# Patient Record
Sex: Male | Born: 1995 | Race: White | Hispanic: No | Marital: Single | Smoking: Current every day smoker
Health system: Southern US, Community
[De-identification: ages and names within clinical notes are randomized; demographics above are authoritative.]

## PROBLEM LIST (undated history)

## (undated) DIAGNOSIS — K219 Gastro-esophageal reflux disease without esophagitis: Secondary | ICD-10-CM

## (undated) DIAGNOSIS — J45909 Unspecified asthma, uncomplicated: Secondary | ICD-10-CM

## (undated) HISTORY — PX: NO PAST SURGERIES: SHX2092

---

## 2016-12-04 ENCOUNTER — Ambulatory Visit (HOSPITAL_COMMUNITY)
Admission: EM | Admit: 2016-12-04 | Discharge: 2016-12-04 | Disposition: A | Discharge status: Home / Self Care | Payer: Self-pay | Attending: Internal Medicine | Admitting: Internal Medicine

## 2016-12-04 ENCOUNTER — Encounter (HOSPITAL_COMMUNITY): Payer: Self-pay | Admitting: Family Medicine

## 2016-12-04 DIAGNOSIS — R300 Dysuria: Secondary | ICD-10-CM | POA: Insufficient documentation

## 2016-12-04 DIAGNOSIS — R369 Urethral discharge, unspecified: Secondary | ICD-10-CM | POA: Insufficient documentation

## 2016-12-04 LAB — POCT URINALYSIS DIP (DEVICE)
Glucose, UA: NEGATIVE mg/dL
Ketones, ur: NEGATIVE mg/dL
Nitrite: NEGATIVE
PH: 6 (ref 5.0–8.0)
Protein, ur: NEGATIVE mg/dL
Specific Gravity, Urine: 1.025 (ref 1.005–1.030)
UROBILINOGEN UA: 0.2 mg/dL (ref 0.0–1.0)

## 2016-12-04 MED ORDER — AZITHROMYCIN 250 MG PO TABS
1000.0000 mg | ORAL_TABLET | Freq: Once | ORAL | Status: AC
  Administered 2016-12-04: 1000 mg via ORAL

## 2016-12-04 MED ORDER — CEFTRIAXONE SODIUM 250 MG IJ SOLR
INTRAMUSCULAR | Status: AC
  Filled 2016-12-04: qty 250

## 2016-12-04 MED ORDER — AZITHROMYCIN 250 MG PO TABS
ORAL_TABLET | ORAL | Status: AC
  Filled 2016-12-04: qty 4

## 2016-12-04 MED ORDER — CEFTRIAXONE SODIUM 250 MG IJ SOLR
250.0000 mg | Freq: Once | INTRAMUSCULAR | Status: AC
  Administered 2016-12-04: 250 mg via INTRAMUSCULAR

## 2016-12-04 MED ORDER — CEPHALEXIN 500 MG PO CAPS: 500.0000 mg | ORAL_CAPSULE | Freq: Four times a day (QID) | ORAL | 0 refills | Status: DC

## 2016-12-04 NOTE — ED Provider Notes (Signed)
CSN: [id number]     Arrival date & time 12/04/16  1212 History   First MD Initiated Contact with Patient 12/04/16 1331     Chief Complaint  Patient presents with  . Dysuria   (Consider location/radiation/quality/duration/timing/severity/associated sxs/prior Treatment) 21 year old male presents to the urgent care with burning dysuria, penile discharge and cloudy urine. No associated frequency. No fever. He states that he believes he has a UTI.      History reviewed. No pertinent past medical history. History reviewed. No pertinent surgical history. History reviewed. No pertinent family history. Social History  Substance Use Topics  . Smoking status: Never Smoker  . Smokeless tobacco: Never Used  . Alcohol use Not on file    Review of Systems  Constitutional: Negative.   HENT: Negative.   Respiratory: Negative.   Gastrointestinal: Negative.   Genitourinary: Positive for discharge and dysuria. Negative for frequency, hematuria, penile swelling, scrotal swelling, testicular pain and urgency.  All other systems reviewed and are negative.   Allergies  Patient has no known allergies.  Home Medications   Prior to Admission medications   Medication Sig Start Date End Date Taking? Authorizing Provider  cephALEXin (KEFLEX) 500 MG capsule Take 1 capsule (500 mg total) by mouth 4 (four) times daily. 12/04/16   Hayden Rasmussenavid Trexton Escamilla, NP   Meds Ordered and Administered this Visit   Medications  cefTRIAXone (ROCEPHIN) injection 250 mg (not administered)  azithromycin (ZITHROMAX) tablet 1,000 mg (not administered)    BP 138/93   Pulse 66   Temp 98.4 F (36.9 C)   Resp 18   SpO2 100%  No data found.   Physical Exam  Constitutional: He is oriented to person, place, and time. He appears well-developed and well-nourished. No distress.  Eyes: EOM are normal.  Neck: Normal range of motion. Neck supple.  Cardiovascular: Normal rate.   Pulmonary/Chest: Effort normal. No respiratory distress.   Musculoskeletal: He exhibits no edema.  Neurological: He is alert and oriented to person, place, and time. He exhibits normal muscle tone.  Skin: Skin is warm and dry.  Psychiatric: He has a normal mood and affect.  Nursing note and vitals reviewed.   Urgent Care Course     Procedures (including critical care time)  Labs Review Labs Reviewed  POCT URINALYSIS DIP (DEVICE) - Abnormal; Notable for the following:       Result Value   Bilirubin Urine SMALL (*)    Hgb urine dipstick TRACE (*)    Leukocytes, UA TRACE (*)    All other components within normal limits  URINE CULTURE  URINE CYTOLOGY ANCILLARY ONLY    Imaging Review No results found.   Visual Acuity Review  Right Eye Distance:   Left Eye Distance:   Bilateral Distance:    Right Eye Near:   Left Eye Near:    Bilateral Near:         MDM   1. Dysuria   2. Urethral discharge in male   You are being treated for possible STD although this has not been diagnosed at this time. Her symptoms suggest STD but your urinalysis and symptoms could also be due to a urinary tract infection. A culture. Urine is underway and the result should be ready in about 3 days. Testing for STD has also been sent to the lab and we should have those results and 1-2 days. Take the medication as directed, drink plenty of fluids and stay well-hydrated. Meds ordered this encounter  Medications  . cefTRIAXone (ROCEPHIN)  injection 250 mg  . azithromycin (ZITHROMAX) tablet 1,000 mg  . cephALEXin (KEFLEX) 500 MG capsule    Sig: Take 1 capsule (500 mg total) by mouth 4 (four) times daily.    Dispense:  28 capsule    Refill:  0    Order Specific Question:   Supervising Provider    Answer:   Eustace Moore [[id number]]        Hayden Rasmussen, NP 12/04/16 1343

## 2016-12-04 NOTE — Discharge Instructions (Signed)
You are being treated for possible STD although this has not been diagnosed at this time. Her symptoms suggest STD but your urinalysis and symptoms could also be due to a urinary tract infection. A culture. Urine is underway and the result should be ready in about 3 days. Testing for STD has also been sent to the lab and we should have those results and 1-2 days. Take the medication as directed, drink plenty of fluids and stay well-hydrated.

## 2016-12-04 NOTE — ED Triage Notes (Signed)
Pt here fir penile discharge, dysuria and cloudy urine x 1 week.

## 2016-12-05 LAB — URINE CULTURE
Culture: NO GROWTH
SPECIAL REQUESTS: NORMAL

## 2016-12-06 LAB — URINE CYTOLOGY ANCILLARY ONLY
CHLAMYDIA, DNA PROBE: NEGATIVE
Neisseria Gonorrhea: NEGATIVE
TRICH (WINDOWPATH): NEGATIVE

## 2018-01-05 ENCOUNTER — Emergency Department (HOSPITAL_COMMUNITY): Payer: PRIVATE HEALTH INSURANCE

## 2018-01-05 ENCOUNTER — Other Ambulatory Visit: Payer: Self-pay

## 2018-01-05 ENCOUNTER — Emergency Department (HOSPITAL_COMMUNITY)
Admission: EM | Admit: 2018-01-05 | Discharge: 2018-01-05 | Disposition: A | Discharge status: Home / Self Care | Payer: PRIVATE HEALTH INSURANCE | Attending: Emergency Medicine | Admitting: Emergency Medicine

## 2018-01-05 ENCOUNTER — Encounter (HOSPITAL_COMMUNITY): Payer: Self-pay

## 2018-01-05 DIAGNOSIS — F1210 Cannabis abuse, uncomplicated: Secondary | ICD-10-CM | POA: Insufficient documentation

## 2018-01-05 DIAGNOSIS — R112 Nausea with vomiting, unspecified: Secondary | ICD-10-CM

## 2018-01-05 LAB — CBC WITH DIFFERENTIAL/PLATELET
BASOS ABS: 0 10*3/uL (ref 0.0–0.1)
BASOS PCT: 0 %
Eosinophils Absolute: 0.1 10*3/uL (ref 0.0–0.7)
Eosinophils Relative: 1 %
HCT: 42.1 % (ref 39.0–52.0)
HEMOGLOBIN: 14.5 g/dL (ref 13.0–17.0)
LYMPHS PCT: 11 %
Lymphs Abs: 1.1 10*3/uL (ref 0.7–4.0)
MCH: 31 pg (ref 26.0–34.0)
MCHC: 34.4 g/dL (ref 30.0–36.0)
MCV: 90 fL (ref 78.0–100.0)
MONOS PCT: 4 %
Monocytes Absolute: 0.4 10*3/uL (ref 0.1–1.0)
NEUTROS ABS: 8.6 10*3/uL — AB (ref 1.7–7.7)
NEUTROS PCT: 84 %
Platelets: 157 10*3/uL (ref 150–400)
RBC: 4.68 MIL/uL (ref 4.22–5.81)
RDW: 13 % (ref 11.5–15.5)
WBC: 10.2 10*3/uL (ref 4.0–10.5)

## 2018-01-05 LAB — COMPREHENSIVE METABOLIC PANEL
ALBUMIN: 4.4 g/dL (ref 3.5–5.0)
ALK PHOS: 90 U/L (ref 38–126)
ALT: 33 U/L (ref 17–63)
AST: 28 U/L (ref 15–41)
Anion gap: 12 (ref 5–15)
BUN: 17 mg/dL (ref 6–20)
CALCIUM: 9.1 mg/dL (ref 8.9–10.3)
CO2: 24 mmol/L (ref 22–32)
CREATININE: 0.66 mg/dL (ref 0.61–1.24)
Chloride: 103 mmol/L (ref 101–111)
GFR calc Af Amer: >60 mL/min (ref >60)
GFR calc non Af Amer: >60 mL/min (ref >60)
GLUCOSE: 129 mg/dL — AB (ref 65–99)
Potassium: 4.1 mmol/L (ref 3.5–5.1)
Sodium: 139 mmol/L (ref 135–145)
TOTAL PROTEIN: 7.8 g/dL (ref 6.5–8.1)
Total Bilirubin: 1 mg/dL (ref 0.3–1.2)

## 2018-01-05 LAB — LIPASE, BLOOD: Lipase: 42 U/L (ref 11–51)

## 2018-01-05 LAB — URINALYSIS, ROUTINE W REFLEX MICROSCOPIC
BILIRUBIN URINE: NEGATIVE
GLUCOSE, UA: NEGATIVE mg/dL
Hgb urine dipstick: NEGATIVE
KETONES UR: 80 mg/dL — AB
Leukocytes, UA: NEGATIVE
Nitrite: NEGATIVE
Protein, ur: NEGATIVE mg/dL
Specific Gravity, Urine: 1.021 (ref 1.005–1.030)
pH: 7 (ref 5.0–8.0)

## 2018-01-05 LAB — RAPID URINE DRUG SCREEN, HOSP PERFORMED
Amphetamines: NOT DETECTED
BARBITURATES: NOT DETECTED
Benzodiazepines: NOT DETECTED
Cocaine: NOT DETECTED
Opiates: NOT DETECTED
Tetrahydrocannabinol: POSITIVE — AB

## 2018-01-05 MED ORDER — PANTOPRAZOLE SODIUM 40 MG IV SOLR
40.0000 mg | Freq: Once | INTRAVENOUS | Status: AC
  Administered 2018-01-05: 40 mg via INTRAVENOUS
  Filled 2018-01-05: qty 40

## 2018-01-05 MED ORDER — GI COCKTAIL ~~LOC~~
30.0000 mL | Freq: Once | ORAL | Status: AC
  Administered 2018-01-05: 30 mL via ORAL
  Filled 2018-01-05: qty 30

## 2018-01-05 MED ORDER — SODIUM CHLORIDE 0.9 % IV BOLUS
1000.0000 mL | Freq: Once | INTRAVENOUS | Status: AC
  Administered 2018-01-05: 1000 mL via INTRAVENOUS

## 2018-01-05 MED ORDER — PROCHLORPERAZINE EDISYLATE 5 MG/ML IJ SOLN
10.0000 mg | Freq: Once | INTRAMUSCULAR | Status: AC
  Administered 2018-01-05: 10 mg via INTRAVENOUS
  Filled 2018-01-05: qty 2

## 2018-01-05 MED ORDER — FAMOTIDINE 20 MG PO TABS: 20.0000 mg | ORAL_TABLET | Freq: Two times a day (BID) | ORAL | 0 refills | Status: DC

## 2018-01-05 MED ORDER — ONDANSETRON HCL 4 MG/2ML IJ SOLN
4.0000 mg | Freq: Once | INTRAMUSCULAR | Status: AC
  Administered 2018-01-05: 4 mg via INTRAVENOUS
  Filled 2018-01-05: qty 2

## 2018-01-05 MED ORDER — ONDANSETRON 4 MG PO TBDP: 4.0000 mg | ORAL_TABLET | Freq: Three times a day (TID) | ORAL | 0 refills | Status: DC | PRN

## 2018-01-05 NOTE — Discharge Instructions (Signed)
As we discussed, your nausea and vomiting could be due to bad food intake, viral infection, or marijuana use.  You should stop marijuana use as it can lead to nausea and vomiting.  Take the nausea medication as prescribed.  Keep yourself hydrated.  Return to the ED if you develop worsening symptoms including fever, persistent vomiting or right-sided lower abdominal pain or any other concerns.

## 2018-01-05 NOTE — ED Notes (Signed)
EDP at bedside updating patient. 

## 2018-01-05 NOTE — ED Notes (Signed)
Pt given gingerale. Instructed to take small, slow sips. Pt tolerating well at this time.

## 2018-01-05 NOTE — ED Notes (Signed)
Pt returned from xray

## 2018-01-05 NOTE — ED Provider Notes (Signed)
[facility] EMERGENCY DEPARTMENT Provider Note   CSN: [id number] Arrival date & time: 01/05/18  [id number]     History   Chief Complaint Chief Complaint  Patient presents with  . Emesis    HPI Francisco Schneider is a 22 y.o. male.  Patient's significant other reports patient awoke around midnight with nausea, vomiting, generalized body aches, chills.  Emesis consisted of food particles and now became clear with dry heaving.  Denies any blood in the emesis. no documented fever.  Felt well when he went to bed.  Ate hamburgers for dinner but believes they were cooked appropriately.  Denies any diarrhea.  Denies any recent antibiotic use or travel.  Denies any sick contacts.  Complains of abdominal "burning" with diffuse tenderness.  No previous abdominal surgeries.  No pain with urination or blood in the urine.  Patient did have "flulike symptoms" for the past 1 week with cough, sore throat, runny nose and sneezing but that improved 2 days ago.   The history is provided by the patient and the spouse.  Emesis   Associated symptoms include abdominal pain and chills. Pertinent negatives include no arthralgias, no cough, no diarrhea, no fever, no headaches and no myalgias.    History reviewed. No pertinent past medical history.  There are no active problems to display for this patient.   History reviewed. No pertinent surgical history.      Home Medications    Prior to Admission medications   Medication Sig Start Date End Date Taking? Authorizing Provider  cephALEXin (KEFLEX) 500 MG capsule Take 1 capsule (500 mg total) by mouth 4 (four) times daily. 12/04/16   Hayden RasmussenMabe, David, NP    Family History No family history on file.  Social History Social History   Tobacco Use  . Smoking status: Never Smoker  . Smokeless tobacco: Never Used  Substance Use Topics  . Alcohol use: Not on file  . Drug use: Not on file     Allergies   Patient has no known allergies.   Review of  Systems Review of Systems  Constitutional: Positive for activity change, appetite change and chills. Negative for fever.  HENT: Negative for congestion, rhinorrhea and sore throat.   Respiratory: Negative for cough, chest tightness and shortness of breath.   Cardiovascular: Negative for chest pain.  Gastrointestinal: Positive for abdominal pain, nausea and vomiting. Negative for diarrhea.  Genitourinary: Negative for dysuria, hematuria and testicular pain.  Musculoskeletal: Negative for arthralgias and myalgias.  Skin: Negative for rash.  Neurological: Positive for weakness. Negative for dizziness and headaches.   all other systems are negative except as noted in the HPI and PMH.     Physical Exam Updated Vital Signs BP 127/83   Pulse 73   Temp (!) 97.5 F (36.4 C)   Resp 18   Ht 5\' 11"  (1.803 m)   Wt 65.8 kg (145 lb)   SpO2 100%   BMI 20.22 kg/m   Physical Exam  Constitutional: He is oriented to person, place, and time. He appears well-developed and well-nourished. No distress.  Pale appearing  HENT:  Head: Normocephalic and atraumatic.  Mouth/Throat: Oropharynx is clear and moist. No oropharyngeal exudate.  Moist mucus membranes  Eyes: Pupils are equal, round, and reactive to light. Conjunctivae and EOM are normal.  Neck: Normal range of motion. Neck supple.  No meningismus.  Cardiovascular: Normal rate, regular rhythm, normal heart sounds and intact distal pulses.  No murmur heard. Pulmonary/Chest: Effort normal and breath sounds normal.  No respiratory distress.  Abdominal: Soft. There is tenderness. There is no rebound and no guarding.  Mild diffuse tenderness. No guarding or rebound  Musculoskeletal: Normal range of motion. He exhibits no edema or tenderness.  No CVAT  Neurological: He is alert and oriented to person, place, and time. No cranial nerve deficit. He exhibits normal muscle tone. Coordination normal.  No ataxia on finger to nose bilaterally. No pronator  drift. 5/5 strength throughout. CN 2-12 intact.Equal grip strength. Sensation intact.   Skin: Skin is warm.  Psychiatric: He has a normal mood and affect. His behavior is normal.  Nursing note and vitals reviewed.    ED Treatments / Results  Labs (all labs ordered are listed, but only abnormal results are displayed) Labs Reviewed  CBC WITH DIFFERENTIAL/PLATELET - Abnormal; Notable for the following components:      Result Value   Neutro Abs 8.6 (*)    All other components within normal limits  COMPREHENSIVE METABOLIC PANEL - Abnormal; Notable for the following components:   Glucose, Bld 129 (*)    All other components within normal limits  URINALYSIS, ROUTINE W REFLEX MICROSCOPIC - Abnormal; Notable for the following components:   APPearance CLOUDY (*)    Ketones, ur 80 (*)    All other components within normal limits  RAPID URINE DRUG SCREEN, HOSP PERFORMED - Abnormal; Notable for the following components:   Tetrahydrocannabinol POSITIVE (*)    All other components within normal limits  LIPASE, BLOOD    EKG None  Radiology Dg Abdomen Acute W/chest  Result Date: 01/05/2018 CLINICAL DATA:  Vomiting starting at midnight.  Prior cough. EXAM: DG ABDOMEN ACUTE W/ 1V CHEST COMPARISON:  None. FINDINGS: The lungs appear clear.  Cardiac and mediastinal contours normal. No pleural effusion identified. No free intraperitoneal gas. Formed stool in the colon. No significant amount of small bowel gas. No significant abnormal calcifications are identified. IMPRESSION: 1. No significant abnormality is identified. Reduced sensitivity in assessing the small bowel due to the relative lack of gas in the small bowel. Electronically Signed   By: Gaylyn Rong M.D.   On: 01/05/2018 08:03    Procedures Procedures (including critical care time)  Medications Ordered in ED Medications  sodium chloride 0.9 % bolus 1,000 mL (has no administration in time range)  ondansetron (ZOFRAN) injection 4 mg  (has no administration in time range)     Initial Impression / Assessment and Plan / ED Course  I have reviewed the triage vital signs and the nursing notes.  Pertinent labs & imaging results that were available during my care of the patient were reviewed by me and considered in my medical decision making (see chart for details).    Patient with nausea, vomiting, chills abdominal burning since midnight.  No diarrhea or fever Abdomen soft without peritoneal signs  Patient will be hydrated, given symptom control, will check labs and urine.  Labs reassuring.  Anion gap was normal.  Electrolytes white blood cell count are normal. Urinalysis with large ketones.  Patient given additional IV fluids, antiemetics, GI cocktail, PPI. UDS positive for THC, of which patient admits to daily use. D/w patient that this could be contributing to vomiting.  Given 2L IVF, no further vomiting, abdomen remains soft. Suspect due to [facility] use versus bad food intake or viral gastroenteritis though lack of diarrhea would argue against that.   Advised to stop THC use. Followup with PCP. Return precautions discussed.  Final Clinical Impressions(s) / ED Diagnoses  Final diagnoses:  Non-intractable vomiting with nausea, unspecified vomiting type  Marijuana abuse    ED Discharge Orders    None       Freddi Schrager, Jeannett Senior, MD 01/05/18 [phone]

## 2018-01-05 NOTE — ED Notes (Signed)
Pt transported to xray 

## 2018-01-05 NOTE — ED Triage Notes (Signed)
Generalized body aches, nausea, vomiting, chills onset approx midnight, now with dry heaves, denies abd pain

## 2018-12-04 ENCOUNTER — Encounter (HOSPITAL_COMMUNITY): Payer: Self-pay

## 2018-12-04 ENCOUNTER — Other Ambulatory Visit: Payer: Self-pay

## 2018-12-04 ENCOUNTER — Emergency Department (HOSPITAL_COMMUNITY)
Admission: EM | Admit: 2018-12-04 | Discharge: 2018-12-04 | Disposition: A | Discharge status: Home / Self Care | Payer: 59 | Attending: Emergency Medicine | Admitting: Emergency Medicine

## 2018-12-04 ENCOUNTER — Ambulatory Visit (INDEPENDENT_AMBULATORY_CARE_PROVIDER_SITE_OTHER): Payer: PRIVATE HEALTH INSURANCE | Admitting: Otolaryngology

## 2018-12-04 ENCOUNTER — Emergency Department (HOSPITAL_COMMUNITY): Payer: 59

## 2018-12-04 DIAGNOSIS — Y939 Activity, unspecified: Secondary | ICD-10-CM | POA: Insufficient documentation

## 2018-12-04 DIAGNOSIS — F17210 Nicotine dependence, cigarettes, uncomplicated: Secondary | ICD-10-CM | POA: Insufficient documentation

## 2018-12-04 DIAGNOSIS — Y929 Unspecified place or not applicable: Secondary | ICD-10-CM | POA: Diagnosis not present

## 2018-12-04 DIAGNOSIS — Y999 Unspecified external cause status: Secondary | ICD-10-CM | POA: Diagnosis not present

## 2018-12-04 DIAGNOSIS — W458XXA Other foreign body or object entering through skin, initial encounter: Secondary | ICD-10-CM | POA: Diagnosis not present

## 2018-12-04 DIAGNOSIS — T189XXA Foreign body of alimentary tract, part unspecified, initial encounter: Secondary | ICD-10-CM

## 2018-12-04 DIAGNOSIS — R070 Pain in throat: Secondary | ICD-10-CM | POA: Diagnosis not present

## 2018-12-04 DIAGNOSIS — Z79899 Other long term (current) drug therapy: Secondary | ICD-10-CM | POA: Insufficient documentation

## 2018-12-04 MED ORDER — ONDANSETRON 4 MG PO TBDP: ORAL_TABLET | ORAL | 0 refills | Status: DC

## 2018-12-04 MED ORDER — ALUM & MAG HYDROXIDE-SIMETH 200-200-20 MG/5ML PO SUSP
30.0000 mL | Freq: Once | ORAL | Status: AC
  Administered 2018-12-04: 30 mL via ORAL
  Filled 2018-12-04: qty 30

## 2018-12-04 MED ORDER — LIDOCAINE VISCOUS HCL 2 % MT SOLN
15.0000 mL | Freq: Once | OROMUCOSAL | Status: AC
  Administered 2018-12-04: 15 mL via ORAL
  Filled 2018-12-04: qty 15

## 2018-12-04 NOTE — ED Triage Notes (Addendum)
Pt states he was eating Friday night and swallowed a piece of tooth pick on steak on accident. Pt states he feels like it is stuck right in the upper chest area.

## 2018-12-04 NOTE — ED Provider Notes (Signed)
[facility] EMERGENCY DEPARTMENT Provider Note   CSN: [id number] Arrival date & time: 12/04/18  1022    History   Chief Complaint Chief Complaint  Patient presents with  . Swallowed Foreign Body    HPI Francisco Schneider is a 23 y.o. male.     Patient presents with pain anterior chest since swallowing a small toothpick that was in the meat.  Most of it he brought back up when he vomited however a small piece he feels remains.  This happened on Friday.  No fevers or chills.  No blood in the stools or vomit.  Patient try to call specialist however unable to get in.     History reviewed. No pertinent past medical history.  There are no active problems to display for this patient.   History reviewed. No pertinent surgical history.      Home Medications    Prior to Admission medications   Medication Sig Start Date End Date Taking? Authorizing Provider  cephALEXin (KEFLEX) 500 MG capsule Take 1 capsule (500 mg total) by mouth 4 (four) times daily. 12/04/16   Hayden Rasmussen, NP  famotidine (PEPCID) 20 MG tablet Take 1 tablet (20 mg total) by mouth 2 (two) times daily. 01/05/18   Rancour, Jeannett Senior, MD  ondansetron (ZOFRAN ODT) 4 MG disintegrating tablet Take 1 tablet (4 mg total) by mouth every 8 (eight) hours as needed for nausea or vomiting. 01/05/18   Rancour, Jeannett Senior, MD  ondansetron (ZOFRAN ODT) 4 MG disintegrating tablet 4mg  ODT q4 hours prn nausea/vomit 12/04/18   Blane Ohara, MD    Family History No family history on file.  Social History Social History   Tobacco Use  . Smoking status: Current Every Day Smoker    Packs/day: 2.00    Years: 5.00    Pack years: 10.00    Types: Cigarettes  . Smokeless tobacco: Never Used  Substance Use Topics  . Alcohol use: Yes    Alcohol/week: 10.0 standard drinks    Types: 10 Cans of beer per week  . Drug use: Yes    Types: Marijuana    Comment: "alot"     Allergies   Patient has no known allergies.   Review of Systems Review  of Systems  Constitutional: Positive for appetite change. Negative for chills and fever.  HENT: Negative for congestion.   Eyes: Negative for visual disturbance.  Respiratory: Negative for shortness of breath.   Cardiovascular: Positive for chest pain.  Gastrointestinal: Positive for nausea and vomiting. Negative for abdominal pain.  Genitourinary: Negative for dysuria and flank pain.  Musculoskeletal: Negative for back pain, neck pain and neck stiffness.  Skin: Negative for rash.  Neurological: Negative for light-headedness and headaches.     Physical Exam Updated Vital Signs BP 124/68 (BP Location: Right Arm)   Pulse 93   Temp 98.4 F (36.9 C) (Oral)   Resp 18   Ht 5\' 11"  (1.803 m)   Wt 77.1 kg   SpO2 97%   BMI 23.71 kg/m   Physical Exam Vitals signs and nursing note reviewed.  Constitutional:      Appearance: He is well-developed.  HENT:     Head: Normocephalic and atraumatic.  Eyes:     General:        Right eye: No discharge.        Left eye: No discharge.     Conjunctiva/sclera: Conjunctivae normal.  Neck:     Musculoskeletal: Normal range of motion and neck supple.  Trachea: No tracheal deviation.  Cardiovascular:     Rate and Rhythm: Normal rate.  Pulmonary:     Effort: Pulmonary effort is normal.  Abdominal:     General: There is no distension.     Palpations: Abdomen is soft.     Tenderness: There is no abdominal tenderness. There is no guarding.  Skin:    General: Skin is warm.     Findings: No rash.  Neurological:     Mental Status: He is alert and oriented to person, place, and time.      ED Treatments / Results  Labs (all labs ordered are listed, but only abnormal results are displayed) Labs Reviewed - No data to display  EKG None  Radiology Dg Chest 2 View  Result Date: 12/04/2018 CLINICAL DATA:  Foreign body EXAM: CHEST - 2 VIEW COMPARISON:  01/05/2018 FINDINGS: Heart and mediastinal contours are within normal limits. No focal  opacities or effusions. No acute bony abnormality. IMPRESSION: No active cardiopulmonary disease. Electronically Signed   By: Charlett Nose M.D.   On: 12/04/2018 11:26    Procedures Procedures (including critical care time)  Medications Ordered in ED Medications  alum & mag hydroxide-simeth (MAALOX/MYLANTA) 200-200-20 MG/5ML suspension 30 mL (30 mLs Oral Given 12/04/18 1302)    And  lidocaine (XYLOCAINE) 2 % viscous mouth solution 15 mL (15 mLs Oral Given 12/04/18 1302)     Initial Impression / Assessment and Plan / ED Course  I have reviewed the triage vital signs and the nursing notes.  Pertinent labs & imaging results that were available during my care of the patient were reviewed by me and considered in my medical decision making (see chart for details).       Clinical concern for irritation from toothpick versus possibly still stuck in the esophagus.  Patient has no clinical concern currently for perforation or more significant pathology.  Chest x-ray done in triage reviewed unremarkable no acute findings.  Patient while waiting did get into ENT office at 120.  Patient given Zofran as needed and will go directly to ENT office. Results and differential diagnosis were discussed with the patient/parent/guardian. Xrays were independently reviewed by myself.  Close follow up outpatient was discussed, comfortable with the plan.   Medications  alum & mag hydroxide-simeth (MAALOX/MYLANTA) 200-200-20 MG/5ML suspension 30 mL (30 mLs Oral Given 12/04/18 1302)    And  lidocaine (XYLOCAINE) 2 % viscous mouth solution 15 mL (15 mLs Oral Given 12/04/18 1302)    Vitals:   12/04/18 1106 12/04/18 1107  BP: 124/68   Pulse: 93   Resp: 18   Temp: 98.4 F (36.9 C)   TempSrc: Oral   SpO2: 97%   Weight:  77.1 kg  Height:  5\' 11"  (1.803 m)    Final diagnoses:  Foreign body ingestion, initial encounter     Final Clinical Impressions(s) / ED Diagnoses   Final diagnoses:  Foreign body  ingestion, initial encounter    ED Discharge Orders         Ordered    ondansetron (ZOFRAN ODT) 4 MG disintegrating tablet     12/04/18 1306           Blane Ohara, MD 12/04/18 1308

## 2018-12-04 NOTE — Discharge Instructions (Addendum)
Go directly to ENT office. If no improvement or worsening symptoms see Gastroenterology. Return to er for severe pain, fevers, recurrent vomiting. Use zofran as needed vomiting.

## 2018-12-11 ENCOUNTER — Ambulatory Visit (INDEPENDENT_AMBULATORY_CARE_PROVIDER_SITE_OTHER): Payer: 59 | Admitting: Otolaryngology

## 2018-12-11 DIAGNOSIS — T17208A Unspecified foreign body in pharynx causing other injury, initial encounter: Secondary | ICD-10-CM | POA: Diagnosis not present

## 2018-12-11 DIAGNOSIS — R070 Pain in throat: Secondary | ICD-10-CM | POA: Diagnosis not present

## 2018-12-12 ENCOUNTER — Encounter (HOSPITAL_BASED_OUTPATIENT_CLINIC_OR_DEPARTMENT_OTHER): Payer: Self-pay | Admitting: *Deleted

## 2018-12-12 ENCOUNTER — Other Ambulatory Visit: Payer: Self-pay

## 2018-12-12 ENCOUNTER — Other Ambulatory Visit: Payer: Self-pay | Admitting: Otolaryngology

## 2018-12-15 ENCOUNTER — Encounter (HOSPITAL_BASED_OUTPATIENT_CLINIC_OR_DEPARTMENT_OTHER)
Admission: RE | Disposition: A | Discharge status: Home / Self Care | Payer: Self-pay | Source: Home / Self Care | Attending: Otolaryngology

## 2018-12-15 ENCOUNTER — Ambulatory Visit (HOSPITAL_BASED_OUTPATIENT_CLINIC_OR_DEPARTMENT_OTHER)
Admission: RE | Admit: 2018-12-15 | Discharge: 2018-12-15 | Disposition: A | Discharge status: Home / Self Care | Payer: 59 | Attending: Otolaryngology | Admitting: Otolaryngology

## 2018-12-15 ENCOUNTER — Ambulatory Visit (HOSPITAL_BASED_OUTPATIENT_CLINIC_OR_DEPARTMENT_OTHER): Payer: 59 | Admitting: Certified Registered"

## 2018-12-15 ENCOUNTER — Other Ambulatory Visit: Payer: Self-pay

## 2018-12-15 ENCOUNTER — Encounter (HOSPITAL_BASED_OUTPATIENT_CLINIC_OR_DEPARTMENT_OTHER): Payer: Self-pay | Admitting: *Deleted

## 2018-12-15 DIAGNOSIS — T18198A Other foreign object in esophagus causing other injury, initial encounter: Secondary | ICD-10-CM | POA: Insufficient documentation

## 2018-12-15 DIAGNOSIS — K219 Gastro-esophageal reflux disease without esophagitis: Secondary | ICD-10-CM | POA: Insufficient documentation

## 2018-12-15 DIAGNOSIS — R0789 Other chest pain: Secondary | ICD-10-CM | POA: Diagnosis not present

## 2018-12-15 DIAGNOSIS — J45909 Unspecified asthma, uncomplicated: Secondary | ICD-10-CM | POA: Diagnosis not present

## 2018-12-15 DIAGNOSIS — T17408A Unspecified foreign body in trachea causing other injury, initial encounter: Secondary | ICD-10-CM

## 2018-12-15 DIAGNOSIS — F17200 Nicotine dependence, unspecified, uncomplicated: Secondary | ICD-10-CM | POA: Diagnosis not present

## 2018-12-15 DIAGNOSIS — T18108A Unspecified foreign body in esophagus causing other injury, initial encounter: Secondary | ICD-10-CM

## 2018-12-15 HISTORY — DX: Unspecified asthma, uncomplicated: J45.909

## 2018-12-15 HISTORY — PX: RIGID ESOPHAGOSCOPY: SHX5226

## 2018-12-15 HISTORY — PX: RIGID BRONCHOSCOPY: SHX5069

## 2018-12-15 HISTORY — DX: Gastro-esophageal reflux disease without esophagitis: K21.9

## 2018-12-15 SURGERY — ESOPHAGOSCOPY, RIGID
Anesthesia: General | Site: Throat

## 2018-12-15 MED ORDER — HYDROMORPHONE HCL 1 MG/ML IJ SOLN: 0.2500 mg | INTRAMUSCULAR | Status: DC | PRN

## 2018-12-15 MED ORDER — FENTANYL CITRATE (PF) 100 MCG/2ML IJ SOLN
50.0000 ug | INTRAMUSCULAR | Status: DC | PRN
  Administered 2018-12-15: 100 ug via INTRAVENOUS

## 2018-12-15 MED ORDER — DEXMEDETOMIDINE HCL 200 MCG/2ML IV SOLN
INTRAVENOUS | Status: DC | PRN
  Administered 2018-12-15: 12 ug via INTRAVENOUS

## 2018-12-15 MED ORDER — PROMETHAZINE HCL 25 MG/ML IJ SOLN: 6.2500 mg | INTRAMUSCULAR | Status: DC | PRN

## 2018-12-15 MED ORDER — FENTANYL CITRATE (PF) 100 MCG/2ML IJ SOLN
INTRAMUSCULAR | Status: AC
  Filled 2018-12-15: qty 2

## 2018-12-15 MED ORDER — DEXAMETHASONE SODIUM PHOSPHATE 10 MG/ML IJ SOLN
INTRAMUSCULAR | Status: AC
  Filled 2018-12-15: qty 1

## 2018-12-15 MED ORDER — SUGAMMADEX SODIUM 200 MG/2ML IV SOLN
INTRAVENOUS | Status: AC
  Filled 2018-12-15: qty 2

## 2018-12-15 MED ORDER — ONDANSETRON HCL 4 MG/2ML IJ SOLN
INTRAMUSCULAR | Status: AC
  Filled 2018-12-15: qty 2

## 2018-12-15 MED ORDER — PROPOFOL 10 MG/ML IV BOLUS
INTRAVENOUS | Status: DC | PRN
  Administered 2018-12-15: 200 mg via INTRAVENOUS

## 2018-12-15 MED ORDER — DEXAMETHASONE SODIUM PHOSPHATE 4 MG/ML IJ SOLN
INTRAMUSCULAR | Status: DC | PRN
  Administered 2018-12-15: 10 mg via INTRAVENOUS

## 2018-12-15 MED ORDER — OXYCODONE HCL 5 MG/5ML PO SOLN: 5.0000 mg | Freq: Once | ORAL | Status: DC | PRN

## 2018-12-15 MED ORDER — SCOPOLAMINE 1 MG/3DAYS TD PT72: 1.0000 | MEDICATED_PATCH | Freq: Once | TRANSDERMAL | Status: DC | PRN

## 2018-12-15 MED ORDER — GLYCOPYRROLATE 0.2 MG/ML IJ SOLN
INTRAMUSCULAR | Status: DC | PRN
  Administered 2018-12-15: 0.2 mg via INTRAVENOUS

## 2018-12-15 MED ORDER — ROCURONIUM BROMIDE 100 MG/10ML IV SOLN
INTRAVENOUS | Status: DC | PRN
  Administered 2018-12-15: 50 mg via INTRAVENOUS

## 2018-12-15 MED ORDER — ONDANSETRON HCL 4 MG/2ML IJ SOLN
INTRAMUSCULAR | Status: DC | PRN
  Administered 2018-12-15: 4 mg via INTRAVENOUS

## 2018-12-15 MED ORDER — LIDOCAINE 2% (20 MG/ML) 5 ML SYRINGE
INTRAMUSCULAR | Status: AC
  Filled 2018-12-15: qty 5

## 2018-12-15 MED ORDER — SUGAMMADEX SODIUM 500 MG/5ML IV SOLN
INTRAVENOUS | Status: DC | PRN
  Administered 2018-12-15: 400 mg via INTRAVENOUS

## 2018-12-15 MED ORDER — LIDOCAINE HCL (CARDIAC) PF 100 MG/5ML IV SOSY
PREFILLED_SYRINGE | INTRAVENOUS | Status: DC | PRN
  Administered 2018-12-15: 100 mg via INTRAVENOUS

## 2018-12-15 MED ORDER — MEPERIDINE HCL 25 MG/ML IJ SOLN: 6.2500 mg | INTRAMUSCULAR | Status: DC | PRN

## 2018-12-15 MED ORDER — MIDAZOLAM HCL 2 MG/2ML IJ SOLN
1.0000 mg | INTRAMUSCULAR | Status: DC | PRN
  Administered 2018-12-15: 2 mg via INTRAVENOUS

## 2018-12-15 MED ORDER — OXYCODONE HCL 5 MG PO TABS: 5.0000 mg | ORAL_TABLET | Freq: Once | ORAL | Status: DC | PRN

## 2018-12-15 MED ORDER — PROPOFOL 10 MG/ML IV BOLUS
INTRAVENOUS | Status: AC
  Filled 2018-12-15: qty 20

## 2018-12-15 MED ORDER — EPINEPHRINE 30 MG/30ML IJ SOLN
INTRAMUSCULAR | Status: AC
  Filled 2018-12-15: qty 1

## 2018-12-15 MED ORDER — MIDAZOLAM HCL 2 MG/2ML IJ SOLN
INTRAMUSCULAR | Status: AC
  Filled 2018-12-15: qty 2

## 2018-12-15 MED ORDER — ROCURONIUM BROMIDE 50 MG/5ML IV SOSY
PREFILLED_SYRINGE | INTRAVENOUS | Status: AC
  Filled 2018-12-15: qty 5

## 2018-12-15 MED ORDER — LACTATED RINGERS IV SOLN
INTRAVENOUS | Status: DC
  Administered 2018-12-15: 11:00:00 via INTRAVENOUS

## 2018-12-15 MED ORDER — GLYCOPYRROLATE PF 0.2 MG/ML IJ SOSY
PREFILLED_SYRINGE | INTRAMUSCULAR | Status: AC
  Filled 2018-12-15: qty 1

## 2018-12-15 MED ORDER — OXYMETAZOLINE HCL 0.05 % NA SOLN
NASAL | Status: AC
  Filled 2018-12-15: qty 30

## 2018-12-15 SURGICAL SUPPLY — 24 items
BRONCHOSCOPE BIFLEX 5.0 DISP (MISCELLANEOUS) IMPLANT
CANISTER SUCT 1200ML W/VALVE (MISCELLANEOUS) ×3 IMPLANT
COVER WAND RF STERILE (DRAPES) IMPLANT
GAUZE SPONGE 4X4 12PLY STRL LF (GAUZE/BANDAGES/DRESSINGS) ×6 IMPLANT
GLOVE BIO SURGEON STRL SZ7.5 (GLOVE) ×3 IMPLANT
GLOVE BIOGEL PI IND STRL 7.0 (GLOVE) ×2 IMPLANT
GLOVE BIOGEL PI INDICATOR 7.0 (GLOVE) ×1
GLOVE SURG SYN 7.5  E (GLOVE) ×1
GLOVE SURG SYN 7.5 E (GLOVE) ×2 IMPLANT
GOWN STRL REUS W/ TWL LRG LVL3 (GOWN DISPOSABLE) IMPLANT
GOWN STRL REUS W/ TWL XL LVL3 (GOWN DISPOSABLE) ×2 IMPLANT
GOWN STRL REUS W/TWL LRG LVL3 (GOWN DISPOSABLE)
GOWN STRL REUS W/TWL XL LVL3 (GOWN DISPOSABLE) ×1
GUARD TEETH (MISCELLANEOUS) ×3 IMPLANT
MARKER SKIN DUAL TIP RULER LAB (MISCELLANEOUS) IMPLANT
NEEDLE SPNL 22GX7 QUINCKE BK (NEEDLE) IMPLANT
NS IRRIG 1000ML POUR BTL (IV SOLUTION) ×3 IMPLANT
PACK BASIN DAY SURGERY FS (CUSTOM PROCEDURE TRAY) IMPLANT
SHEET MEDIUM DRAPE 40X70 STRL (DRAPES) ×3 IMPLANT
SLEEVE SCD COMPRESS KNEE MED (MISCELLANEOUS) ×3 IMPLANT
SOLUTION BUTLER CLEAR DIP (MISCELLANEOUS) ×3 IMPLANT
SURGILUBE 2OZ TUBE FLIPTOP (MISCELLANEOUS) ×3 IMPLANT
TOWEL GREEN STERILE FF (TOWEL DISPOSABLE) ×3 IMPLANT
TUBE CONNECTING 20X1/4 (TUBING) ×3 IMPLANT

## 2018-12-15 NOTE — Anesthesia Procedure Notes (Signed)
Procedure Name: Intubation Performed by: Verita Lamb, CRNA Pre-anesthesia Checklist: Patient identified, Emergency Drugs available, Suction available, Patient being monitored and Timeout performed Patient Re-evaluated:Patient Re-evaluated prior to induction Oxygen Delivery Method: Circle system utilized Preoxygenation: Pre-oxygenation with 100% oxygen Induction Type: IV induction Ventilation: Mask ventilation without difficulty Laryngoscope Size: Mac and 4 Grade View: Grade I Tube type: Oral Tube size: 8.0 mm Number of attempts: 1 Airway Equipment and Method: Stylet Placement Confirmation: ETT inserted through vocal cords under direct vision,  positive ETCO2,  CO2 detector and breath sounds checked- equal and bilateral Secured at: 22 cm Tube secured with: Tape Dental Injury: Teeth and Oropharynx as per pre-operative assessment

## 2018-12-15 NOTE — Op Note (Signed)
DATE OF PROCEDURE:  12/15/2018                              OPERATIVE REPORT  SURGEON:  Newman Pies, MD  PREOPERATIVE DIAGNOSES: 1. Ingested foreign body 2. Throat and chest pain  POSTOPERATIVE DIAGNOSES: 1. Ingested foreign body 2. Throat and chest pain  PROCEDURE PERFORMED:   1. Rigid esophagoscopy 2. Rigid bronchoscopy  ANESTHESIA:  General endotracheal tube anesthesia.  COMPLICATIONS:  None.  ESTIMATED BLOOD LOSS:  None  INDICATION FOR PROCEDURE:  Francisco Schneider is a 23 y.o. male who swallowed part of a toothpick 2 weeks ago.  His laryngoscopy at that time showed no evidence of foreign body or infection.  Conservative observation was recommended.  The patient presents today complaining of worsening of his throat and chest pain.  He reports a constant foreign body sensation in his upper chest.  He suspects he either swallowed the foreign body into his esophagus or aspirated the foreign body into the trachea. Based on the above findings, the decision is made for the patient to undergo the above stated procedure.The risks, benefits, alternatives, and details of the procedure are discussed with the patient.  Questions are invited and answered.   DESCRIPTION:  The patient was taken to the operating room and placed supine on the operating table.  General endotracheal tube anesthesia was administered by the anesthesiologist.  The patient was positioned and prepped and draped in a standard fashion for endoscopy exam.  A rigid esophagoscope was inserted via the oral cavity into the esophageal inlet. The esophagoscope was advanced into the esophageal lumen. A small area of abrasion was noted in the proximal esophagus. No foreign body was noted throughout the esophagus. The esophagoscope was withdrawn.  The endotracheal tube was then removed. A rigid bronchoscope was inserted via the oral cavity into the trachea. The trachea lumen, carina, mainstem bronchi, and segmental takeoffs were all noted to  be normal. No foreign body was noted. The bronchoscope was withdrawn.  The care of the patient was turned over to the anesthesiologist.  The patient was awakened from anesthesia without difficulty.  The patient was extubated and transferred to the recovery room in good condition.  OPERATIVE FINDINGS:  A small area of abrasion was noted in the proximal esophagus.  SPECIMEN:  None  FOLLOWUP CARE:  The patient will be discharged home once awake and alert.    Francisco Schneider 12/15/2018 4:21 PM

## 2018-12-15 NOTE — Anesthesia Postprocedure Evaluation (Signed)
Anesthesia Post Note  Patient: Aniruddh Hartlieb  Procedure(s) Performed: LARYNGOSCOPY, RIGID ESOPHAGOSCOPY, EXAM UNDER ANESTHESIA (N/A Throat) RIGID BRONCHOSCOPY (N/A Throat)     Patient location during evaluation: PACU Anesthesia Type: General Level of consciousness: awake and alert Pain management: pain level controlled Vital Signs Assessment: post-procedure vital signs reviewed and stable Respiratory status: spontaneous breathing, nonlabored ventilation and respiratory function stable Cardiovascular status: blood pressure returned to baseline and stable Postop Assessment: no apparent nausea or vomiting Anesthetic complications: no    Last Vitals:  Vitals:   12/15/18 1430 12/15/18 1445  BP: (!) 104/59 121/73  Pulse: (!) 51 76  Resp: 18 15  Temp:    SpO2: 100% 99%    Last Pain:  Vitals:   12/15/18 1430  TempSrc:   PainSc: 3                  Lowella Curb

## 2018-12-15 NOTE — H&P (Signed)
Cc: Persistent foreign body sensation in throat/chest  HPI:  The patient is a 23 year old male who returns today for his follow-up evaluation. The patient was last seen 1 week ago. At that time, he was complaining of a persistent foreign body sensation in his throat.  The patient believed he swallowed part of a toothpick 10 days ago.  His laryngoscopy at that time showed no evidence of foreign body or infection.  Conservative observation was recommended.  The patient presents today complaining of worsening of his throat and chest pain.  He reports a constant foreign body sensation in his upper chest.  He suspects he either swallowed the foreign body into his esophagus or aspirated the foreign body into the trachea.  He would like further evaluation of the possible foreign body.   Exam:  The nasal cavities were decongested and anesthetised with a combination of oxymetazoline and 4% lidocaine solution.  The flexible scope was inserted into the right nasal cavity and advanced towards the nasopharynx.  Visualized mucosa over the turbinates and septum were normal.  The nasopharynx was clear.  Oropharyngeal walls were symmetric and mobile without lesion, mass, or edema.  Hypopharynx was also without  lesion or edema.  Larynx was mobile without lesions.  No lesions or asymmetry in the supraglottic larynx.  Arytenoid mucosa was normal.  True vocal folds were pale yellow and without mass or lesion.  Base of tongue was within normal limits. The patient tolerated the procedure well.    Assessment: 1.  The patient has a normal laryngoscopy examination today.  No obvious foreign body or mucosal edema/erythema is noted.   2.  The patient is severely distressed from possible foreign body in his esophagus or trachea.  Plan: 1.  The laryngoscopy findings are reviewed with the patient.  2.  The options of continuing conservative observation versus operative intervention with esophagoscopy and bronchoscopy are discussed.   The risks, benefits and details of the options are reviewed with the patient.  3.  The patient would like to proceed with esophagoscopy and bronchoscopy in the operating room, with possible foreign body removal.

## 2018-12-15 NOTE — Anesthesia Preprocedure Evaluation (Signed)
Anesthesia Evaluation  Patient identified by MRN, date of birth, ID band Patient awake    Reviewed: Allergy & Precautions, NPO status , Patient's Chart, lab work & pertinent test results  Airway Mallampati: II  TM Distance: >3 FB Neck ROM: Full    Dental no notable dental hx.    Pulmonary asthma , Current Smoker,    Pulmonary exam normal breath sounds clear to auscultation       Cardiovascular negative cardio ROS Normal cardiovascular exam Rhythm:Regular Rate:Normal     Neuro/Psych negative neurological ROS  negative psych ROS   GI/Hepatic Neg liver ROS, GERD  ,  Endo/Other  negative endocrine ROS  Renal/GU negative Renal ROS  negative genitourinary   Musculoskeletal negative musculoskeletal ROS (+)   Abdominal   Peds negative pediatric ROS (+)  Hematology negative hematology ROS (+)   Anesthesia Other Findings   Reproductive/Obstetrics negative OB ROS                             Anesthesia Physical Anesthesia Plan  ASA: II  Anesthesia Plan: General   Post-op Pain Management:    Induction: Intravenous  PONV Risk Score and Plan: 1 and Ondansetron  Airway Management Planned: Oral ETT  Additional Equipment:   Intra-op Plan:   Post-operative Plan: Extubation in OR  Informed Consent: I have reviewed the patients History and Physical, chart, labs and discussed the procedure including the risks, benefits and alternatives for the proposed anesthesia with the patient or authorized representative who has indicated his/her understanding and acceptance.     Dental advisory given  Plan Discussed with: CRNA  Anesthesia Plan Comments:         Anesthesia Quick Evaluation

## 2018-12-15 NOTE — Transfer of Care (Signed)
Immediate Anesthesia Transfer of Care Note  Patient: Francisco Schneider  Procedure(s) Performed: LARYNGOSCOPY, RIGID ESOPHAGOSCOPY, EXAM UNDER ANESTHESIA (N/A Throat) RIGID BRONCHOSCOPY (N/A Throat)  Patient Location: PACU  Anesthesia Type:General  Level of Consciousness: awake, alert  and oriented  Airway & Oxygen Therapy: Patient Spontanous Breathing and Patient connected to face mask oxygen  Post-op Assessment: Report given to RN and Post -op Vital signs reviewed and stable  Post vital signs: Reviewed and stable  Last Vitals:  Vitals Value Taken Time  BP 107/60 12/15/2018  2:15 PM  Temp    Pulse 54 12/15/2018  2:17 PM  Resp 21 12/15/2018  2:17 PM  SpO2 100 % 12/15/2018  2:17 PM  Vitals shown include unvalidated device data.  Last Pain:  Vitals:   12/15/18 1108  TempSrc: Oral  PainSc: 0-No pain         Complications: No apparent anesthesia complications

## 2018-12-15 NOTE — Discharge Instructions (Addendum)
Resume all previous activities and diet.      Post Anesthesia Home Care Instructions  Activity: Get plenty of rest for the remainder of the day. A responsible individual must stay with you for 24 hours following the procedure.  For the next 24 hours, DO NOT: -Drive a car -Advertising copywriter -Drink alcoholic beverages -Take any medication unless instructed by your physician -Make any legal decisions or sign important papers.  Meals: Start with liquid foods such as gelatin or soup. Progress to regular foods as tolerated. Avoid greasy, spicy, heavy foods. If nausea and/or vomiting occur, drink only clear liquids until the nausea and/or vomiting subsides. Call your physician if vomiting continues.  Special Instructions/Symptoms: Your throat may feel dry or sore from the anesthesia or the breathing tube placed in your throat during surgery. If this causes discomfort, gargle with warm salt water. The discomfort should disappear within 24 hours.  If you had a scopolamine patch placed behind your ear for the management of post- operative nausea and/or vomiting:  1. The medication in the patch is effective for 72 hours, after which it should be removed.  Wrap patch in a tissue and discard in the trash. Wash hands thoroughly with soap and water. 2. You may remove the patch earlier than 72 hours if you experience unpleasant side effects which may include dry mouth, dizziness or visual disturbances. 3. Avoid touching the patch. Wash your hands with soap and water after contact with the patch.

## 2018-12-18 ENCOUNTER — Encounter (HOSPITAL_BASED_OUTPATIENT_CLINIC_OR_DEPARTMENT_OTHER): Payer: Self-pay | Admitting: Otolaryngology

## 2019-09-21 IMAGING — DX DG CHEST 2V
2 series · 2 of 2 positions shown · non-contrast
Comparison: 01/05/2018

CLINICAL DATA: Foreign body

EXAM:
CHEST - 2 VIEW

[chest pa]
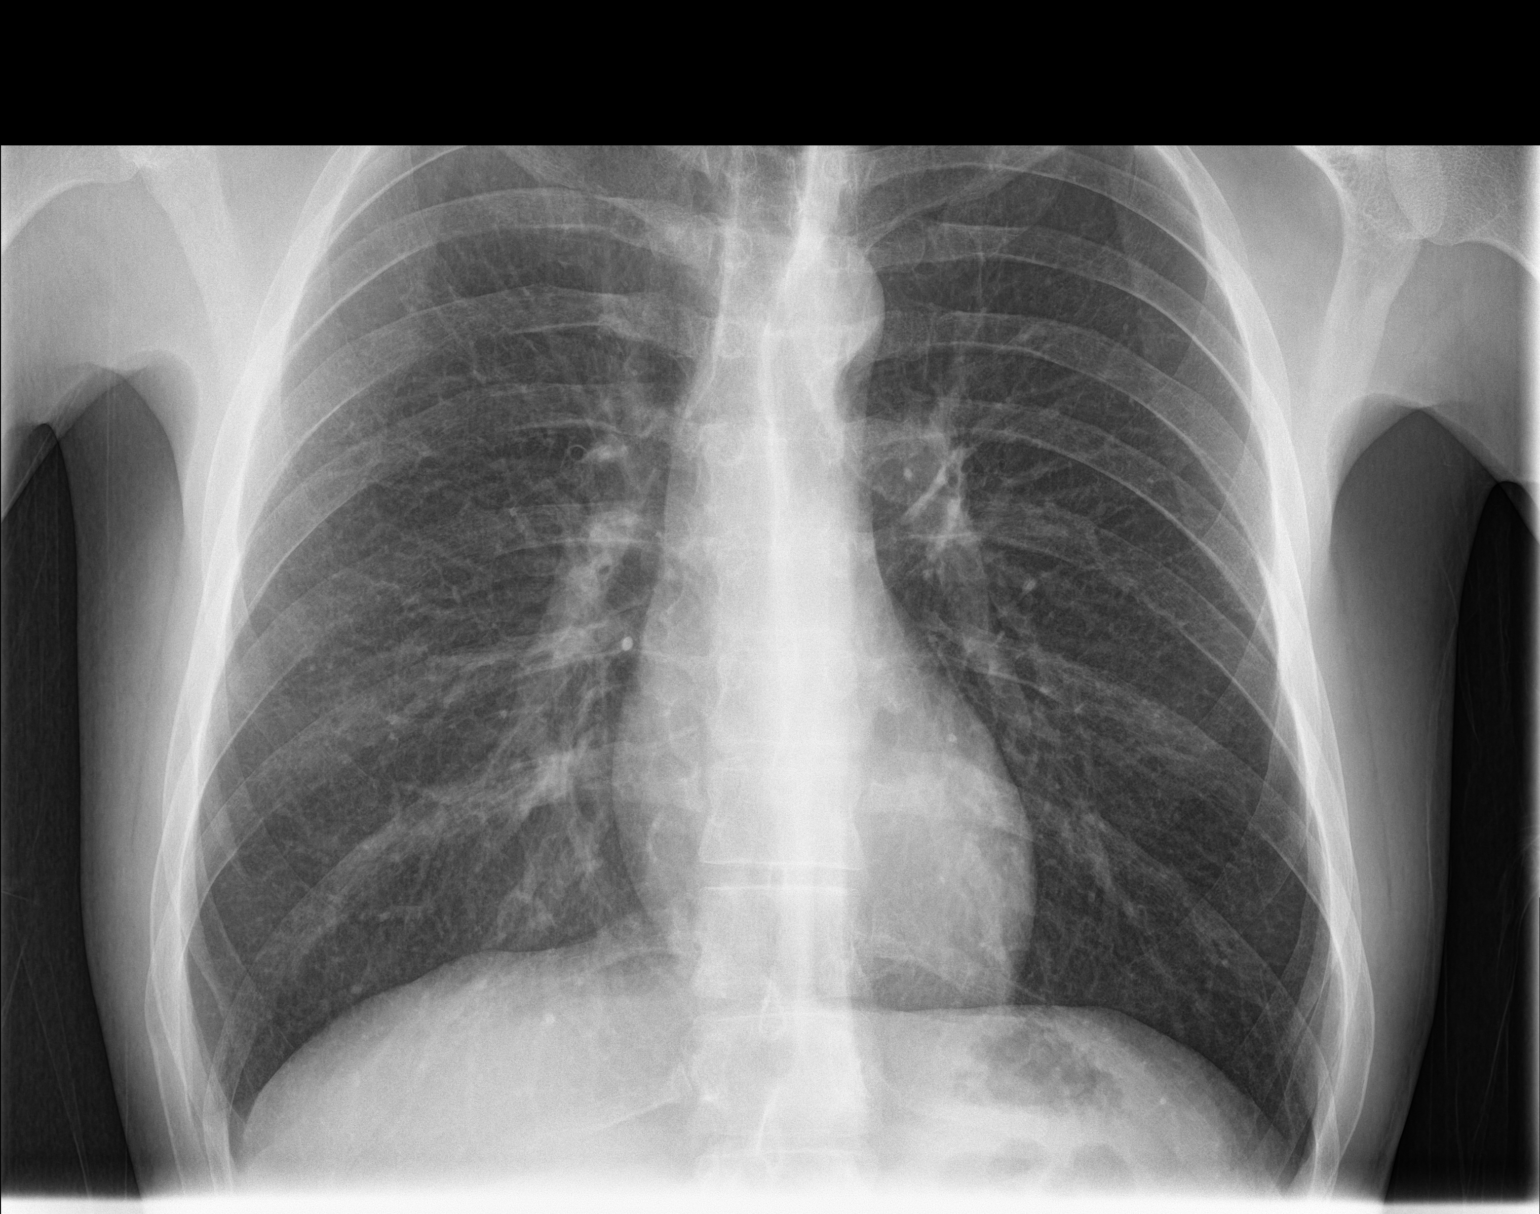

[chest lat]
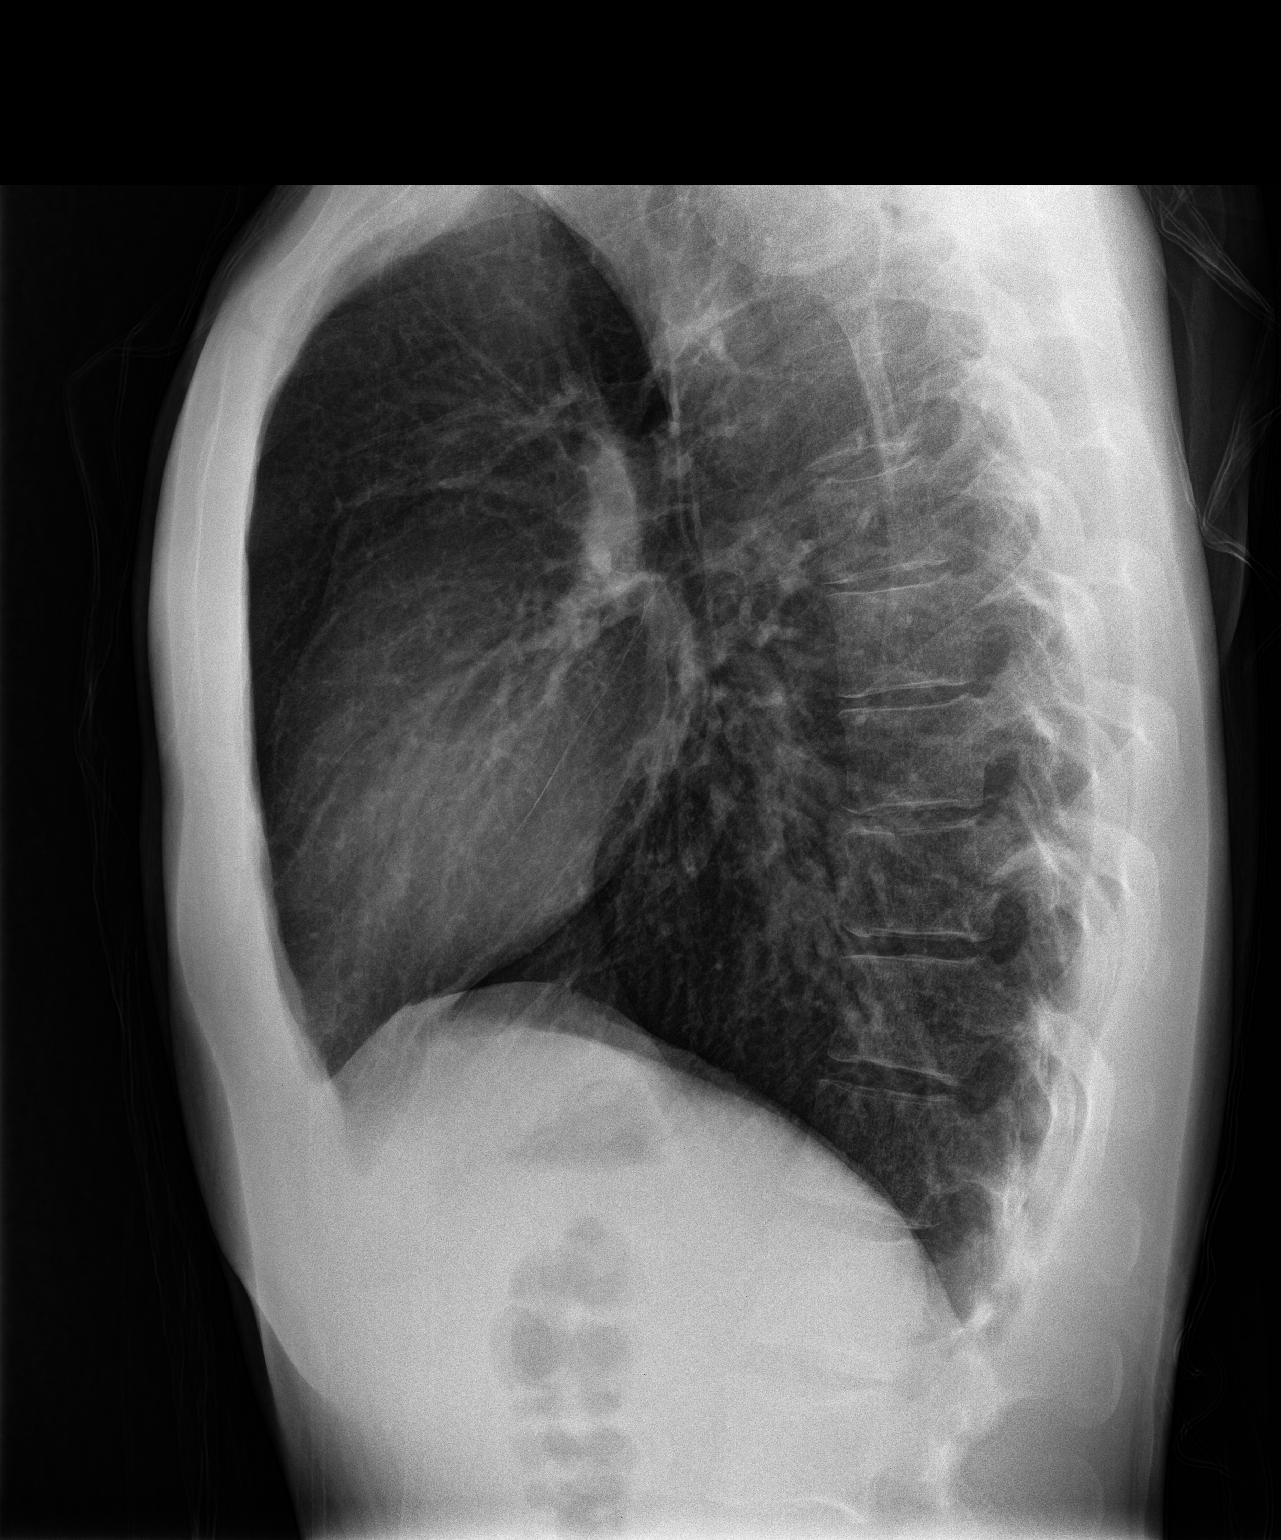

[2 of 2 positions shown; findings below may reference images not displayed]

FINDINGS: Heart and mediastinal contours are within normal limits. No focal
opacities or effusions. No acute bony abnormality.
IMPRESSION: No active cardiopulmonary disease.
